# Patient Record
Sex: Male | Born: 1995 | Race: White | Hispanic: No | Marital: Single | Smoking: Current some day smoker
Health system: Southern US, Community
[De-identification: ages and names within clinical notes are randomized; demographics above are authoritative.]

## PROBLEM LIST (undated history)

## (undated) DIAGNOSIS — F1110 Opioid abuse, uncomplicated: Secondary | ICD-10-CM

## (undated) DIAGNOSIS — F329 Major depressive disorder, single episode, unspecified: Secondary | ICD-10-CM

## (undated) DIAGNOSIS — F909 Attention-deficit hyperactivity disorder, unspecified type: Secondary | ICD-10-CM

## (undated) DIAGNOSIS — F1310 Sedative, hypnotic or anxiolytic abuse, uncomplicated: Secondary | ICD-10-CM

## (undated) DIAGNOSIS — F419 Anxiety disorder, unspecified: Secondary | ICD-10-CM

## (undated) DIAGNOSIS — F32A Depression, unspecified: Secondary | ICD-10-CM

## (undated) HISTORY — DX: Anxiety disorder, unspecified: F41.9

## (undated) HISTORY — DX: Opioid abuse, uncomplicated: F11.10

## (undated) HISTORY — DX: Sedative, hypnotic or anxiolytic abuse, uncomplicated: F13.10

## (undated) HISTORY — DX: Attention-deficit hyperactivity disorder, unspecified type: F90.9

## (undated) HISTORY — PX: CYSTIC HYGROMA EXCISION: SHX450

## (undated) HISTORY — DX: Depression, unspecified: F32.A

## (undated) HISTORY — PX: KNEE ARTHROSCOPY WITH ANTERIOR CRUCIATE LIGAMENT (ACL) REPAIR: SHX5644

---
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
## 2017-01-06 ENCOUNTER — Ambulatory Visit (INDEPENDENT_AMBULATORY_CARE_PROVIDER_SITE_OTHER): Payer: BLUE CROSS/BLUE SHIELD | Admitting: Internal Medicine

## 2017-01-06 ENCOUNTER — Encounter: Payer: Self-pay | Admitting: Internal Medicine

## 2017-01-06 DIAGNOSIS — J40 Bronchitis, not specified as acute or chronic: Secondary | ICD-10-CM | POA: Diagnosis not present

## 2017-01-06 MED ORDER — DOXYCYCLINE HYCLATE 100 MG PO TABS: 100.0000 mg | ORAL_TABLET | Freq: Two times a day (BID) | ORAL | 0 refills | Status: DC

## 2017-01-06 NOTE — Patient Instructions (Signed)
Keep taking the zyrtec daily.   We have sent in doxycycline to take twice a day for 1 week to clear up the sinuses and lungs.

## 2017-01-07 DIAGNOSIS — J40 Bronchitis, not specified as acute or chronic: Secondary | ICD-10-CM | POA: Insufficient documentation

## 2017-01-07 NOTE — Assessment & Plan Note (Signed)
Rx for doxycycline BID for 1 week for symptoms. Note to return to work on Thursday given fever last night.

## 2017-01-07 NOTE — Progress Notes (Signed)
   Subjective:    Patient ID: Cameron Kent, male    DOB: [birth year], 21 y.o.   MRN: [id number]  HPI The patient is a 21 YO man coming in for cold symptoms for about 2-3 weeks. Started off more as allergies and in the last 1-2 weeks has gotten worse. Denies chills but some fevers in the last couple of days. He is coughing a lot and mostly clear to yellow. Denies ear pain or drainage. Some sinus pressure. Mild SOB with exertion and has been off work for several days with the fevers. He is having some nose drainage.He is taking zyrtec over the counter.   Review of Systems  Constitutional: Positive for activity change and fever. Negative for appetite change, chills, fatigue and unexpected weight change.  HENT: Positive for congestion, rhinorrhea and sinus pressure. Negative for ear discharge, ear pain, postnasal drip, sinus pain, sore throat, trouble swallowing and voice change.   Eyes: Negative.   Respiratory: Positive for cough and shortness of breath. Negative for chest tightness and wheezing.   Cardiovascular: Negative.   Musculoskeletal: Negative.   Neurological: Negative.       Objective:   Physical Exam  Constitutional: He is oriented to person, place, and time. He appears well-developed and well-nourished.  HENT:  Head: Normocephalic and atraumatic.  Oropharynx with redness and drainage, nose without crusting.   Eyes: EOM are normal.  Neck: Normal range of motion.  Cardiovascular: Normal rate and regular rhythm.   Pulmonary/Chest: Effort normal.  Scattered rhonchi in the bases which partially clear with coughing.   Abdominal: Soft.  Neurological: He is alert and oriented to person, place, and time.  Skin: Skin is warm and dry.   Vitals:   01/06/17 1316  BP: 112/60  Pulse: 71  Resp: 12  Temp: 98.2 F (36.8 C)  TempSrc: Oral  SpO2: 98%  Weight: 127 lb (57.6 kg)  Height: 5\' 11"  (1.803 m)      Assessment & Plan:

## 2018-03-12 ENCOUNTER — Ambulatory Visit: Payer: 59 | Admitting: Psychology

## 2018-03-12 DIAGNOSIS — F411 Generalized anxiety disorder: Secondary | ICD-10-CM

## 2018-03-26 ENCOUNTER — Ambulatory Visit: Payer: 59 | Admitting: Psychology

## 2018-04-09 ENCOUNTER — Ambulatory Visit: Payer: 59 | Admitting: Psychology

## 2018-04-09 DIAGNOSIS — F411 Generalized anxiety disorder: Secondary | ICD-10-CM | POA: Diagnosis not present

## 2018-05-05 ENCOUNTER — Ambulatory Visit: Payer: Self-pay | Admitting: Psychology

## 2018-05-19 ENCOUNTER — Ambulatory Visit: Payer: Self-pay | Admitting: Psychology

## 2018-10-18 ENCOUNTER — Encounter: Payer: Self-pay | Admitting: Internal Medicine

## 2018-10-18 ENCOUNTER — Ambulatory Visit: Payer: 59 | Admitting: Internal Medicine

## 2018-10-18 VITALS — BP 112/62 | HR 86 | Temp 99.5°F | Ht 71.0 in | Wt 145.2 lb

## 2018-10-18 DIAGNOSIS — J029 Acute pharyngitis, unspecified: Secondary | ICD-10-CM

## 2018-10-18 DIAGNOSIS — R6889 Other general symptoms and signs: Secondary | ICD-10-CM | POA: Diagnosis not present

## 2018-10-18 LAB — POCT INFLUENZA A/B
INFLUENZA A, POC: NEGATIVE
Influenza B, POC: NEGATIVE

## 2018-10-18 LAB — POCT RAPID STREP A (OFFICE): RAPID STREP A SCREEN: NEGATIVE

## 2018-10-18 NOTE — Progress Notes (Signed)
Chief Complaint  Patient presents with  . Sore Throat    pt states it hurts to swallow, has fever, chills and body ache    HPI: Cameron Kent 23 y.o. come in for SDA PCP APPT NA  onset x 2 days .  Of fever chills feeling sore throat and nasal congestion.   StatesFeels like flu   Had vaccine ,  Says feels like flu   "No cough yet" No strep exposure     Sore throat   Hurts  to swallow and nasal congestion  Ibuprofen .  Health treatment rest.  Able to take liquids but is difficult swallowing at times. [employer]  Missed yesterday.  Will need note for work  Temp  99.5    amox as baby  As a rash .  ROS: See pertinent positives and negatives per HPI. No cp sob  Vomiting diarrhea rash   No past medical history on file.  Family History  Problem Relation Age of Onset  . COPD Maternal Grandmother   . Asthma Maternal Grandmother   . Hearing loss Maternal Grandmother   . Heart disease Maternal Grandmother   . Hypertension Maternal Grandmother   . Hearing loss Paternal Grandfather     Social History   Socioeconomic History  . Marital status: Single    Spouse name: Not on file  . Number of children: Not on file  . Years of education: Not on file  . Highest education level: Not on file  Occupational History  . Not on file  Social Needs  . Financial resource strain: Not on file  . Food insecurity:    Worry: Not on file    Inability: Not on file  . Transportation needs:    Medical: Not on file    Non-medical: Not on file  Tobacco Use  . Smoking status: Current Some Day Smoker  . Smokeless tobacco: Never Used  Substance and Sexual Activity  . Alcohol use: Yes  . Drug use: Yes    Types: Marijuana  . Sexual activity: Never  Lifestyle  . Physical activity:    Days per week: Not on file    Minutes per session: Not on file  . Stress: Not on file  Relationships  . Social connections:    Talks on phone: Not on file    Gets together: Not on file    Attends religious  service: Not on file    Active member of club or organization: Not on file    Attends meetings of clubs or organizations: Not on file    Relationship status: Not on file  Other Topics Concern  . Not on file  Social History Narrative  . Not on file    Outpatient Medications Prior to Visit  Medication Sig Dispense Refill  . propranolol (INDERAL) 10 MG tablet TK 1 T PO BID PRN  FOR ANXIETY    . QUEtiapine (SEROQUEL) 100 MG tablet TK 1 T PO QHS    . sertraline (ZOLOFT) 100 MG tablet TK 2 TS PO QAM    . sertraline (ZOLOFT) 50 MG tablet     . ADDERALL XR 20 MG 24 hr capsule Take 20 mg by mouth daily.  0  . doxycycline (VIBRA-TABS) 100 MG tablet Take 1 tablet (100 mg total) by mouth 2 (two) times daily. 14 tablet 0  . doxycycline (VIBRAMYCIN) 100 MG capsule Take 1 capsule (100 mg total) by mouth daily. 90 capsule 1   No facility-administered medications  prior to visit.      EXAM:  BP 112/62 (BP Location: Right Arm, Patient Position: Sitting, Cuff Size: Normal)   Pulse 86   Temp 99.5 F (37.5 C) (Oral)   Ht 5\' 11"  (1.803 m)   Wt 145 lb 3.2 oz (65.9 kg)   BMI 20.25 kg/m   Body mass index is 20.25 kg/m.  GENERAL: vitals reviewed and listed above, alert, oriented, appears well hydrated  looks  Mildly ill non toxic   Uncomfortable  HEENT: atraumatic, conjunctiva  clear, no obvious abnormalities on inspection of external nose and ears tmx clear nose congested face nontender OP : tonsil 1+  Poss early exudate pin point  No edema    NECK: no obvious masses on inspection tender 1+ ac nodes shoddy pc  LUNGS: clear to auscultation bilaterally, no wheezes, rales or rhonchi, good air movement CV: HRRR, no clubbing cyanosis or  peripheral edema nl cap refill  Abdomen:  Sof,t normal bowel sounds without hepatosplenomegaly, no guarding rebound or masses no CVA tenderness  MS: moves all extremities without noticeable focal  Abnormality Skin: normal capillary refill ,turgor , color: No acute  rashes ,petechiae or bruising Neuro grossly non focal : pleasant and cooperative,   BP Readings from Last 3 Encounters:  10/18/18 112/62  01/06/17 112/60  12/10/15 (!) 90/48 (<1 %, Z <-2.33 /  3 %, Z = -1.91)*   *BP percentiles are based on the 2017 AAP Clinical Practice Guideline for boys  Rapid strep and flu screen are both negative.  ASSESSMENT AND PLAN:  Discussed the following assessment and plan:  Acute pharyngitis, unspecified etiology - Plan: POCT rapid strep A, Culture, Group A Strep  Flu-like symptoms - Plan: POC Influenza A/B Acute respiratory illness with throat anterior mild adenopathy upper respiratory congestion throat culture sent.  Option to take Tamiflu but is 48 hours into it and declines since no compelling reason. Supportive care note for work follow-up with alarm symptoms dehydration etc. Consider mono as a diagnosis if ongoing. -Patient advised to return or notify health care team  if  new concerns arise.  Patient Instructions  Checking for strep   Flu screen is  Can miss some cases of flu .   Gargles ibuprofen  rst fluids      Note for work  .  Fever  should be gone in another  2 days  If  persistent or progressive   Plan fu .  Cameron Kent. Azula Zappia M.D.

## 2018-10-18 NOTE — Patient Instructions (Addendum)
Checking for strep   Flu screen is  Can miss some cases of flu .   Gargles ibuprofen  rst fluids      Note for work  .  Fever  should be gone in another  2 days  If  persistent or progressive   Plan fu .

## 2018-10-20 ENCOUNTER — Telehealth: Payer: Self-pay | Admitting: Internal Medicine

## 2018-10-20 LAB — CULTURE, GROUP A STREP
MICRO NUMBER:: 233359
SPECIMEN QUALITY:: ADEQUATE

## 2018-10-20 NOTE — Telephone Encounter (Signed)
Reviewed lab result and physician's note with patient regarding -throat culture.

## 2018-12-08 ENCOUNTER — Encounter: Payer: Self-pay | Admitting: Internal Medicine

## 2018-12-08 ENCOUNTER — Ambulatory Visit (INDEPENDENT_AMBULATORY_CARE_PROVIDER_SITE_OTHER): Payer: 59 | Admitting: Internal Medicine

## 2018-12-08 DIAGNOSIS — F419 Anxiety disorder, unspecified: Secondary | ICD-10-CM | POA: Insufficient documentation

## 2018-12-08 DIAGNOSIS — F411 Generalized anxiety disorder: Secondary | ICD-10-CM

## 2018-12-08 DIAGNOSIS — F1111 Opioid abuse, in remission: Secondary | ICD-10-CM | POA: Diagnosis not present

## 2018-12-08 DIAGNOSIS — F1311 Sedative, hypnotic or anxiolytic abuse, in remission: Secondary | ICD-10-CM

## 2018-12-08 MED ORDER — BUSPIRONE HCL 10 MG PO TABS: 10.0000 mg | ORAL_TABLET | Freq: Two times a day (BID) | ORAL | 1 refills | Status: DC

## 2018-12-08 NOTE — Assessment & Plan Note (Signed)
Cameron Kent is complaining of severe anxiety.  He used to see Yvette Rack with [facility] psychiatry.  Apparently her services are not covered by his parents insurance any longer.  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  He was discharged on Seroquel to help him sleep, propranolol for panic attacks, and Zoloft 150 mg daily.  Zoloft seems to help a little.  He denies using drugs or alcohol.  He lives at home now.  He is not suicidal or homicidal. Continue with the Zoloft at 150 mg/day. Added BuSpar 10 mg twice a day Virtual your physical appointment in 10-14 days

## 2018-12-08 NOTE — Progress Notes (Signed)
Virtual Visit via Telephone Note  I connected with [name] on 12/08/18 at  3:00 PM EDT by telephone and verified that I am speaking with the correct person using two identifiers.   I discussed the limitations, risks, security and privacy concerns of performing an evaluation and management service by telephone and the availability of in person appointments. I also discussed with the patient that there may be a patient responsible charge related to this service. The patient expressed understanding and agreed to proceed.   History of Present Illness:   Cameron Kent is complaining of severe anxiety.  He used to see Cameron Kent with [facility] psychiatry.  Apparently her services are not covered by his parents insurance any longer.  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  He was discharged on Seroquel to help him sleep, propranolol for panic attacks, and Zoloft 150 mg daily.  Zoloft seems to help a little.  He denies using drugs or alcohol.  He lives at home now.  He is not suicidal or homicidal. Observations/Objective:  Montez Morita is in no acute distress.  He appears restless Assessment and Plan:  See plan Follow Up Instructions:    I discussed the assessment and treatment plan with the patient. The patient was provided an opportunity to ask questions and all were answered. The patient agreed with the plan and demonstrated an understanding of the instructions.   The patient was advised to call back or seek an in-person evaluation if the symptoms worsen or if the condition fails to improve as anticipated.  I provided 20 minutes of non-face-to-face time during this encounter.   Sonda Primes, MD

## 2019-11-14 ENCOUNTER — Ambulatory Visit: Payer: 59 | Admitting: Internal Medicine

## 2019-11-17 ENCOUNTER — Other Ambulatory Visit: Payer: Self-pay

## 2019-11-17 ENCOUNTER — Ambulatory Visit (INDEPENDENT_AMBULATORY_CARE_PROVIDER_SITE_OTHER): Payer: 59 | Admitting: Physician Assistant

## 2019-11-17 ENCOUNTER — Encounter: Payer: Self-pay | Admitting: Physician Assistant

## 2019-11-17 VITALS — BP 132/71 | HR 82 | Ht 72.0 in | Wt 138.0 lb

## 2019-11-17 DIAGNOSIS — G4700 Insomnia, unspecified: Secondary | ICD-10-CM

## 2019-11-17 DIAGNOSIS — F411 Generalized anxiety disorder: Secondary | ICD-10-CM

## 2019-11-17 DIAGNOSIS — F331 Major depressive disorder, recurrent, moderate: Secondary | ICD-10-CM

## 2019-11-17 MED ORDER — HYDROXYZINE HCL 10 MG PO TABS: 10.0000 mg | ORAL_TABLET | Freq: Three times a day (TID) | ORAL | 1 refills | Status: DC | PRN

## 2019-11-17 MED ORDER — FLUVOXAMINE MALEATE 50 MG PO TABS: 50.0000 mg | ORAL_TABLET | Freq: Every day | ORAL | 1 refills | Status: DC

## 2019-11-17 NOTE — Progress Notes (Signed)
[facility] MD/PA/NP Initial Note  11/17/2019 5:43 PM Cameron Kent  MRN:  [id number]  Chief Complaint:  Chief Complaint    Anxiety      HPI: He has had anxiety all of his life.  Looking back, he can see even in kindergarten he had separation anxiety.  Up until the fourth grade, he did not want to go to school and be separated from his parents.  He would become severely homesick if he ever went anywhere to spend the night.  As soon as it got dark, he got really anxious and wanted to go home.  In the past few years, he has sought help.  He saw Yvette Rack, NP at another practice a few years ago and was put on Zoloft.  That helped for a while but kind of made him numb.  He had a friend pass away and it did not really even bother him.  So he did not like that medication for that reason.  He is also had trouble sleeping for quite some time and he was put on Seroquel for that.  He only takes it as needed and it is helpful.  He does not need it every night.  He had Ativan for the anxiety but states it was not that effective.  For the past few months, on top of the anxiety he has been experiencing some sadness.  He does not enjoy things as much as he did.  He is able to go to work.  Energy and motivation are okay.  But he has had fleeting suicidal ideations.  "I do not have a plan.  I just sometimes wonder what it would be like if I were not here."  States that is new for him and he realizes that he needed to get back in and get help.  He has never had times of increased energy with decreased need for sleep except at times of anxiety he will not be able to sleep well.  No impulsivity or risky behaviors.  No increased spending or grandiosity, no irritability, no hallucinations.  The anxiety is a daily occurrence.  He worries about things a lot, even things out of his control.  He does have some OCD tendencies and likes things in a certain way, will check door locks sometimes but these things do not  cut into his day, make him late for work or that kind of thing.  He will get panic attacks several days a week.  That is associated with palpitations, he gets clammy, sometimes short of breath talk when he is that anxious.  It usually last anywhere from 20 to 30 minutes most of the time.  But it feels like a lot longer than that.  Visit Diagnosis:    ICD-10-CM   1. Generalized anxiety disorder  F41.1   2. Major depressive disorder, recurrent episode, moderate (HCC)  F33.1   3. Insomnia, unspecified type  G47.00     Past Psychiatric History:  Has a history of cutting but not in a while.  The last time was in 2019.  He has never been hospitalized for any psychiatric reasons.  He has never had a suicide attempt.  Past medications for mental health diagnoses include: Zoloft helped initially but caused emotional flattening,  Prozac no relief, Seroquel for sleep which helped, Trazodone worked for [employer] but then quit.  Ativan wasn't always effective.   Past Medical History:  Past Medical History:  Diagnosis Date  . ADHD (attention deficit hyperactivity  disorder)    took Adderall as a kid  . Anxiety   . Depression     Past Surgical History:  Procedure Laterality Date  . CYSTIC HYGROMA EXCISION    . KNEE ARTHROSCOPY WITH ANTERIOR CRUCIATE LIGAMENT (ACL) REPAIR Right   . LAPAROSCOPIC APPENDECTOMY  12/27/2011   Procedure: APPENDECTOMY LAPAROSCOPIC;  Surgeon: Judie Petit. Leonia Corona, MD;  Location: [facility] OR;  Service: Pediatrics;  Laterality: N/A;    Family Psychiatric History:   Family History:  Family History  Problem Relation Age of Onset  . COPD Maternal Grandmother   . Asthma Maternal Grandmother   . Hearing loss Maternal Grandmother   . Heart disease Maternal Grandmother   . Hypertension Maternal Grandmother   . Hearing loss Paternal Grandfather   . Bone cancer Paternal Grandfather   . Anxiety disorder Mother   . Hypertension Father   . Healthy Sister   . Dementia Maternal Grandfather    . Healthy Paternal Grandmother   . Bipolar disorder Cousin     Social History:  Social History   Socioeconomic History  . Marital status: Single    Spouse name: Not on file  . Number of children: 0  . Years of education: Not on file  . Highest education level: Some college, no degree  Occupational History  . Occupation: Adriana Simas     Comment: [employer]  Tobacco Use  . Smoking status: Current Some Day Smoker    Types: E-cigarettes  . Smokeless tobacco: Never Used  Substance and Sexual Activity  . Alcohol use: Yes    Alcohol/week: 0.0 - 1.0 standard drinks  . Drug use: Yes    Types: Marijuana    Comment: qd  . Sexual activity: Never  Other Topics Concern  . Not on file  Social History Narrative   Single, never married.  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   Lives alone, he moved out of parent's home at 55, with a roommate.  No roommate now.   Went to [facility] for a little while, didn't know what he wanted to do so dropped out.  Hopes to go back sometime.   Home-life was good at home. Mom is retired from Control and instrumentation engineer, and Dad sells package labels.    Sister is 42 yo.   No religious beliefs.    Legal- caught with marijuana. He had to do 10 hours of community service.   Caffeine-1 coke per day occas.   Social Determinants of Health   Financial Resource Strain:   . Difficulty of Paying Living Expenses:   Food Insecurity:   . Worried About Programme researcher, broadcasting/film/video in the Last Year:   . Barista in the Last Year:   Transportation Needs:   . Freight forwarder (Medical):   Marland Kitchen Lack of Transportation (Non-Medical):   Physical Activity:   . Days of Exercise per Week:   . Minutes of Exercise per Session:   Stress:   . Feeling of Stress :   Social Connections:   . Frequency of Communication with Friends and Family:   . Frequency of Social Gatherings with Friends and Family:   . Attends Religious Services:   . Active Member of Clubs or  Organizations:   . Attends Banker Meetings:   Marland Kitchen Marital Status:     Allergies:  Allergies  Allergen Reactions  . Amoxicillin Rash    Metabolic Disorder Labs: No results found for: HGBA1C, MPG No results  found for: PROLACTIN No results found for: CHOL, TRIG, HDL, CHOLHDL, VLDL, LDLCALC Lab Results  Component Value Date   TSH 0.61 12/10/2015    Therapeutic Level Labs: No results found for: LITHIUM No results found for: VALPROATE No components found for:  CBMZ  Current Medications: Current Outpatient Medications  Medication Sig Dispense Refill  . QUEtiapine (SEROQUEL) 100 MG tablet Take 100 mg by mouth at bedtime as needed.     . fluvoxaMINE (LUVOX) 50 MG tablet Take 1 tablet (50 mg total) by mouth at bedtime. 30 tablet 1  . hydrOXYzine (ATARAX/VISTARIL) 10 MG tablet Take 1-3 tablets (10-30 mg total) by mouth 3 (three) times daily as needed. 60 tablet 1   No current facility-administered medications for this visit.    Medication Side Effects: none  Orders placed this visit:  No orders of the defined types were placed in this encounter.   Psychiatric Specialty Exam:  Review of Systems  Constitutional: Negative.   HENT: Negative.   Eyes: Negative.   Respiratory: Negative.   Cardiovascular: Negative.   Gastrointestinal: Negative.   Endocrine: Negative.   Genitourinary: Negative.   Musculoskeletal: Negative.   Skin: Negative.   Allergic/Immunologic: Negative.   Neurological: Negative.   Hematological: Negative.   Psychiatric/Behavioral: Positive for sleep disturbance and suicidal ideas. Negative for agitation, behavioral problems, confusion, decreased concentration, dysphoric mood, hallucinations and self-injury. The patient is nervous/anxious. The patient is not hyperactive.        Passive SI    Blood pressure 132/71, pulse 82, height 6' (1.829 m), weight 138 lb (62.6 kg).Body mass index is 18.72 kg/m.  General Appearance: Casual, Neat and Well  Groomed  Eye Contact:  Good  Speech:  Clear and Coherent and Normal Rate  Volume:  Normal  Mood:  Anxious  Affect:  Anxious  Thought Process:  Goal Directed and Descriptions of Associations: Intact  Orientation:  Full (Time, Place, and Person)  Thought Content: Logical   Suicidal Thoughts:  No  Homicidal Thoughts:  No  Memory:  WNL  Judgement:  Good  Insight:  Good  Psychomotor Activity:  Normal  Concentration:  Concentration: Good and Attention Span: Good  Recall:  Good  Fund of Knowledge: Good  Language: Good  Assets:  Desire for Improvement  ADL's:  Intact  Cognition: WNL  Prognosis:  Good   Screenings:  GAD-7     Office Visit from 11/17/2019 in [facility]  Total GAD-7 Score  17    PHQ2-9     Office Visit from 11/17/2019 in [facility]  PHQ-2 Total Score  2  PHQ-9 Total Score  8      Receiving Psychotherapy: No   Treatment Plan/Recommendations:  PDMP was reviewed. I spent 1 hour with him. We discussed the diagnoses of anxiety, depression, insomnia and how they can all be intertwined.  Zoloft is a great drug for anxiety and depression so we could either restart that or try different 1.  He would like to try something else since he felt numb on that 1.  I suggest we try 1 more SSRI before going to a different class.  I recommend Luvox because that will help with sleep, anxiety, depression, and any OCD tendencies.  Hopefully we can take care of all those problems with one drug.  We discussed benefits, risks, side effects and he accepts. For the panic attacks, I recommended hydroxyzine rather than trying a different benzodiazepine.  We discussed the addictive potential of benzos and we need  to limit that if at all possible.  He understands and accepts.  I recommend hydroxyzine.  Sedation precautions were given.  Other side effects such as dry mouth, constipation can occur.  He understands. Start Luvox 50 mg, 1 p.o. nightly. Start hydroxyzine  10 mg, 1-3 p.o. 3 times daily as needed. Continue Seroquel 100 mg, 1 p.o. nightly as needed.  He may not need this with taking the Luvox however. I recommend that he get back in counseling.  He will think about it. Return in 4 to 6 weeks.   Melony Overly, PA-C

## 2019-12-29 ENCOUNTER — Encounter: Payer: Self-pay | Admitting: Physician Assistant

## 2019-12-29 ENCOUNTER — Ambulatory Visit (INDEPENDENT_AMBULATORY_CARE_PROVIDER_SITE_OTHER): Payer: 59 | Admitting: Physician Assistant

## 2019-12-29 ENCOUNTER — Other Ambulatory Visit: Payer: Self-pay

## 2019-12-29 DIAGNOSIS — G4700 Insomnia, unspecified: Secondary | ICD-10-CM | POA: Diagnosis not present

## 2019-12-29 DIAGNOSIS — F331 Major depressive disorder, recurrent, moderate: Secondary | ICD-10-CM

## 2019-12-29 DIAGNOSIS — F411 Generalized anxiety disorder: Secondary | ICD-10-CM

## 2019-12-29 MED ORDER — QUETIAPINE FUMARATE 100 MG PO TABS: 50.0000 mg | ORAL_TABLET | Freq: Every evening | ORAL | 1 refills | Status: DC | PRN

## 2019-12-29 MED ORDER — HYDROXYZINE HCL 25 MG PO TABS: 25.0000 mg | ORAL_TABLET | Freq: Three times a day (TID) | ORAL | 1 refills | Status: DC | PRN

## 2019-12-29 MED ORDER — FLUVOXAMINE MALEATE 100 MG PO TABS: 100.0000 mg | ORAL_TABLET | Freq: Every day | ORAL | 1 refills | Status: DC

## 2019-12-29 NOTE — Progress Notes (Signed)
[facility] Med Check  Patient ID: Cameron Kent,  MRN: [id number]  PCP: Tresa Garter, MD  Date of Evaluation: 12/29/2019 Time spent:20 minutes  Chief Complaint:  Chief Complaint    Anxiety; Depression; Insomnia      HISTORY/CURRENT STATUS: HPI For routine med check.  Started Luvox and Hydroxyzine about a month ago. Thinks he might be a little better as far 'taking the edge off' with the Luvox.  States he has had 2 severe panic attacks since the last visit.  He took 20 mg of hydroxyzine which was not beneficial.  One of the panic attacks was so severe he walked off the job.  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  States he is not having any side effects from the medications.  But he needs something to help him that works quickly.  The panic attack will last approximately 30 minutes.  He has shortness of breath, palpitations and feels sweaty when they happen.  States he has old propranolol he thinks is 10 mg, at home and asked if he could use that.  Patient denies loss of interest in usual activities and is able to enjoy things.  Denies decreased energy or motivation.  Appetite has not changed. Denies any changes in concentration, making decisions or remembering things.  Denies suicidal or homicidal thoughts.  Patient denies increased energy with decreased need for sleep, no increased talkativeness, no racing thoughts, no impulsivity or risky behaviors, no increased spending, no increased libido, no grandiosity, no increased irritability or anger, and no hallucinations.  Denies dizziness, syncope, seizures, numbness, tingling, tremor, tics, unsteady gait, slurred speech, confusion. Denies muscle or joint pain, stiffness, or dystonia.  Individual Medical History/ Review of Systems: Changes? :No    Past medications for mental health diagnoses include: Zoloft helped initially but caused emotional flattening,  Prozac no relief, Seroquel for sleep which helped,  Trazodone worked for [employer] but then quit.  Ativan wasn't always effective, Propranolol for anxiety. Burprenorphine/Naloxone  Allergies: Amoxicillin  Current Medications:  Current Outpatient Medications:  .  propranolol (INDERAL) 10 MG tablet, Take 10 mg by mouth 4 (four) times daily as needed., Disp: , Rfl:  .  QUEtiapine (SEROQUEL) 100 MG tablet, Take 0.5 tablets (50 mg total) by mouth at bedtime as needed., Disp: 15 tablet, Rfl: 1 .  Buprenorphine HCl-Naloxone HCl 8-2 MG FILM, Place under the tongue 2 (two) times daily., Disp: , Rfl:  .  fluvoxaMINE (LUVOX) 100 MG tablet, Take 1 tablet (100 mg total) by mouth at bedtime., Disp: 30 tablet, Rfl: 1 .  hydrOXYzine (ATARAX/VISTARIL) 25 MG tablet, Take 1-2 tablets (25-50 mg total) by mouth every 8 (eight) hours as needed., Disp: 60 tablet, Rfl: 1 Medication Side Effects: none  Family Medical/ Social History: Changes? Yes new job at [employer]  MENTAL HEALTH EXAM:  There were no vitals taken for this visit.There is no height or weight on file to calculate BMI.  General Appearance: Casual, Neat and Well Groomed  Eye Contact:  Good  Speech:  Clear and Coherent and Normal Rate  Volume:  Normal  Mood:  Euthymic  Affect:  Appropriate  Thought Process:  Goal Directed and Descriptions of Associations: Intact  Orientation:  Full (Time, Place, and Person)  Thought Content: Logical   Suicidal Thoughts:  No  Homicidal Thoughts:  No  Memory:  WNL  Judgement:  Good  Insight:  Good  Psychomotor Activity:  Restlessness and fidgety  Concentration:  Concentration: Good  Recall:  Good  Fund of Knowledge: Good  Language: Good  Assets:  Desire for Improvement  ADL's:  Intact  Cognition: WNL  Prognosis:  Good    DIAGNOSES:    ICD-10-CM   1. Major depressive disorder, recurrent episode, moderate (HCC)  F33.1   2. Generalized anxiety disorder  F41.1   3. Insomnia, unspecified type  G47.00     Receiving Psychotherapy: No    RECOMMENDATIONS:   PDMP reviewed.  Patient was confronted that he did not tell me about the buprenorphine/naloxone.  He states he stopped taking it a few weeks ago.  "I do not want anything in my body that is not helping."  I spent 20 minutes with him. Increase Luvox to 100 mg, 1 p.o. nightly. Increase hydroxyzine 25 mg, 1-2 3 times daily as needed. Continue Seroquel 100 mg, 1/2 pill nightly as needed sleep. Continue propranolol 10 mg, 1 4 times daily as needed.  Do not take if dizzy.  (He was prescribed this at some point in the past and states it helped with physical symptoms of anxiety.) Return in 6 weeks.  Donnal Moat, PA-C

## 2020-01-19 ENCOUNTER — Ambulatory Visit: Payer: 59 | Attending: Internal Medicine

## 2020-01-19 DIAGNOSIS — Z23 Encounter for immunization: Secondary | ICD-10-CM

## 2020-01-19 NOTE — Progress Notes (Signed)
   Covid-19 Vaccination Clinic  Name:  SARGENT MANKEY    MRN: [id number] DOB: 06-Nov-1995  01/19/2020  Mr. Denn was observed post Covid-19 immunization for 15 minutes without incident. He was provided with Vaccine Information Sheet and instruction to access the V-Safe system.   Mr. Selvidge was instructed to call 911 with any severe reactions post vaccine: Marland Kitchen Difficulty breathing  . Swelling of face and throat  . A fast heartbeat  . A bad rash all over body  . Dizziness and weakness   Immunizations Administered    Name Date Dose VIS Date Route   Moderna COVID-19 Vaccine 01/19/2020  3:20 PM 0.5 mL 07/2019 Intramuscular   Manufacturer: Moderna   Lot: [id number]   NDC: 45364-680-32

## 2020-02-07 ENCOUNTER — Ambulatory Visit: Payer: 59 | Admitting: Physician Assistant

## 2020-02-07 ENCOUNTER — Ambulatory Visit (INDEPENDENT_AMBULATORY_CARE_PROVIDER_SITE_OTHER): Payer: 59 | Admitting: Internal Medicine

## 2020-02-07 ENCOUNTER — Other Ambulatory Visit: Payer: Self-pay

## 2020-02-07 ENCOUNTER — Encounter: Payer: Self-pay | Admitting: Internal Medicine

## 2020-02-07 VITALS — BP 122/78 | HR 63 | Temp 97.9°F | Ht 72.0 in | Wt 127.0 lb

## 2020-02-07 DIAGNOSIS — F411 Generalized anxiety disorder: Secondary | ICD-10-CM

## 2020-02-07 DIAGNOSIS — F1111 Opioid abuse, in remission: Secondary | ICD-10-CM | POA: Diagnosis not present

## 2020-02-07 DIAGNOSIS — F1311 Sedative, hypnotic or anxiolytic abuse, in remission: Secondary | ICD-10-CM | POA: Diagnosis not present

## 2020-02-07 DIAGNOSIS — Z0000 Encounter for general adult medical examination without abnormal findings: Secondary | ICD-10-CM

## 2020-02-07 DIAGNOSIS — Z0001 Encounter for general adult medical examination with abnormal findings: Secondary | ICD-10-CM | POA: Diagnosis not present

## 2020-02-07 MED ORDER — CLONIDINE HCL 0.1 MG PO TABS: 0.1000 mg | ORAL_TABLET | Freq: Two times a day (BID) | ORAL | 0 refills | Status: DC

## 2020-02-07 MED ORDER — VITAMIN D3 50 MCG (2000 UT) PO CAPS: 2000.0000 [IU] | ORAL_CAPSULE | Freq: Every day | ORAL | 3 refills | Status: DC

## 2020-02-07 NOTE — Assessment & Plan Note (Addendum)
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  He was discharged on Seroquel to help him sleep, propranolol for panic attacks, and Zoloft 150 mg daily.  Zoloft seems to help a little.  He denies using drugs or alcohol.  He lives at home now.  He is not suicidal or homicidal. 6/21[name] has had a relapse of his opioid addiction in the winter and spring 2021.  He was snorting heroin and was admitted to inpatient rehab 3 months ago.  He was also using Xanax.  He has been treated at [facility] psychiatry with Suboxone.  He sees them twice a month.  He is very worried about the potential withdrawal symptoms if he stops Suboxone.  He knows not to stop it unless he is instructed by [employer].  He knows that he can use Suboxone long-term.  He has been smoking 1 g of pot daily.  He tells me that at [facility] they run his drug screen test and that they are aware of his marijuana use.  He has been working at [employer] 3 hours a week.  He lives in the house.  His family is very supportive.  He is not very physically active.  He is not suicidal or depressed. On Fluvoxamine, Inderal,

## 2020-02-07 NOTE — Assessment & Plan Note (Addendum)
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  He was discharged on Seroquel to help him sleep, propranolol for panic attacks, and Zoloft 150 mg daily.  Zoloft seems to help a little.  He denies using drugs or alcohol.  He lives at home now.  He is not suicidal or homicidal. 6/21[name] has had a relapse of his opioid addiction in the winter and spring 2021.  He was snorting heroin and was admitted to inpatient rehab 3 months ago.  He was also using Xanax.  He has been treated at [facility] psychiatry with Suboxone.  He sees them twice a month.  He is very worried about the potential withdrawal symptoms if he stops Suboxone.  He knows not to stop it unless he is instructed by [employer].  He knows that he can use Suboxone long-term.  He has been smoking 1 g of pot daily.  He tells me that at [facility] they run his drug screen test and that they are aware of his marijuana use.  He has been working at [employer] 3 hours a week.  He lives in the house.  His family is very supportive.  He is not very physically active.  He is not suicidal or depressed. On Fluvoxamine, Inderal,  I think it would be reasonable to have Carmer have a small prescription for clonidine.

## 2020-02-07 NOTE — Assessment & Plan Note (Signed)
No recent use

## 2020-02-07 NOTE — Progress Notes (Signed)
Subjective:  Patient ID: Cameron Kent, male    DOB: June 20, 1996  Age: 24 y.o. MRN: [id number]  CC: No chief complaint on file.   HPI Cameron Kent presents for a well exam. Cameron Kent has had a relapse of his opioid addiction in the winter and spring 2021.  He was snorting heroin and was admitted to inpatient rehab 3 months ago.  He was also using Xanax.  He has been treated at [facility] psychiatry with Suboxone.  He sees them twice a month.  He is very worried about the potential withdrawal symptoms if he stops Suboxone.  He knows not to stop it unless he is instructed by Cameron Kent.  He knows that he can use Suboxone long-term.  He has been smoking 1 g of pot daily.  He tells me that at [facility] they run his drug screen test and that they are aware of his marijuana use.  He has been working at [employer] 3 hours a week.  He lives in the house.  His family is very supportive.  He is not very physically active.  He is not suicidal or depressed.  Outpatient Medications Prior to Visit  Medication Sig Dispense Refill  . Buprenorphine HCl-Naloxone HCl 8-2 MG FILM Place under the tongue 2 (two) times daily.    . fluvoxaMINE (LUVOX) 100 MG tablet Take 1 tablet (100 mg total) by mouth at bedtime. 30 tablet 1  . hydrOXYzine (ATARAX/VISTARIL) 25 MG tablet Take 1-2 tablets (25-50 mg total) by mouth every 8 (eight) hours as needed. 60 tablet 1  . propranolol (INDERAL) 10 MG tablet Take 10 mg by mouth 4 (four) times daily as needed.    Marland Kitchen QUEtiapine (SEROQUEL) 100 MG tablet Take 0.5 tablets (50 mg total) by mouth at bedtime as needed. 15 tablet 1   No facility-administered medications prior to visit.    ROS: Review of Systems  Constitutional: Negative for appetite change, fatigue and unexpected weight change.  HENT: Negative for congestion, nosebleeds, sneezing, sore throat and trouble swallowing.   Eyes: Negative for itching and visual disturbance.  Respiratory: Negative for cough.    Cardiovascular: Negative for chest pain, palpitations and leg swelling.  Gastrointestinal: Negative for abdominal distention, blood in stool, diarrhea and nausea.  Genitourinary: Negative for frequency and hematuria.  Musculoskeletal: Negative for back pain, gait problem, joint swelling and neck pain.  Skin: Negative for rash.  Neurological: Negative for dizziness, tremors, speech difficulty and weakness.  Psychiatric/Behavioral: Positive for sleep disturbance. Negative for agitation, dysphoric mood and suicidal ideas. The patient is nervous/anxious.     Objective:  BP 122/78 (BP Location: Left Arm, Patient Position: Sitting, Cuff Size: Normal)   Pulse 63   Temp 97.9 F (36.6 C) (Oral)   Ht 6' (1.829 m)   Wt 127 lb (57.6 kg)   SpO2 96%   BMI 17.22 kg/m   BP Readings from Last 3 Encounters:  02/07/20 122/78  10/18/18 112/62  01/06/17 112/60    Wt Readings from Last 3 Encounters:  02/07/20 127 lb (57.6 kg)  10/18/18 145 lb 3.2 oz (65.9 kg)  01/06/17 127 lb (57.6 kg) (11 %, Z= -1.25)*   * Growth percentiles are based on CDC (Boys, 2-20 Years) data.    Physical Exam Constitutional:      General: He is not in acute distress.    Appearance: He is well-developed.     Comments: NAD  Eyes:     Conjunctiva/sclera: Conjunctivae normal.  Pupils: Pupils are equal, round, and reactive to light.  Neck:     Thyroid: No thyromegaly.     Vascular: No JVD.  Cardiovascular:     Rate and Rhythm: Normal rate and regular rhythm.     Heart sounds: Normal heart sounds. No murmur heard.  No friction rub. No gallop.   Pulmonary:     Effort: Pulmonary effort is normal. No respiratory distress.     Breath sounds: Normal breath sounds. No wheezing or rales.  Chest:     Chest wall: No tenderness.  Abdominal:     General: Bowel sounds are normal. There is no distension.     Palpations: Abdomen is soft. There is no mass.     Tenderness: There is no abdominal tenderness. There is no  guarding or rebound.  Musculoskeletal:        General: No tenderness. Normal range of motion.     Cervical back: Normal range of motion.  Lymphadenopathy:     Cervical: No cervical adenopathy.  Skin:    General: Skin is warm and dry.     Findings: No rash.  Neurological:     Mental Status: He is alert and oriented to person, place, and time.     Cranial Nerves: No cranial nerve deficit.     Motor: No abnormal muscle tone.     Coordination: Coordination normal.     Gait: Gait normal.     Deep Tendon Reflexes: Reflexes are normal and symmetric.  Psychiatric:        Behavior: Behavior normal.        Thought Content: Thought content normal.        Judgment: Judgment normal.   Thin.  No acute distress.  No tremor.  Lab Results  Component Value Date   WBC 7.3 12/10/2015   HGB 14.7 12/10/2015   HCT 42.3 12/10/2015   PLT 232.0 12/10/2015   GLUCOSE 90 12/10/2015   ALT 16 12/10/2015   AST 24 12/10/2015   NA 135 12/10/2015   K 4.0 12/10/2015   CL 99 12/10/2015   CREATININE 0.91 12/10/2015   BUN 12 12/10/2015   CO2 25 12/10/2015   TSH 0.61 12/10/2015    No results found.  Assessment & Plan:    Walker Kehr, MD

## 2020-02-07 NOTE — Assessment & Plan Note (Addendum)
  We discussed age appropriate health related issues, including available/recomended screening tests and vaccinations. Labs were ordered to be later reviewed . All questions were answered. We discussed one or more of the following - seat belt use, use of sunscreen/sun exposure exercise, safe sex,  firearm use and storage, seat belt use, a need for adhering to healthy diet and exercise. Labs were ordered . All questions were answered. Cameron Kent is planning to attend [employer] in the fall.  He will start exercising. [facility] - seeing a psychiatrist. On Suboxone. On Fluvoxamine, Inderal,

## 2020-02-16 ENCOUNTER — Ambulatory Visit: Payer: 59 | Attending: Internal Medicine

## 2020-02-16 DIAGNOSIS — Z23 Encounter for immunization: Secondary | ICD-10-CM

## 2020-02-16 NOTE — Progress Notes (Signed)
° °  Covid-19 Vaccination Clinic  Name:  EDRIC FETTERMAN    MRN: [id number] DOB: Nov 20, 1995  02/16/2020  Mr. Pendergraft was observed post Covid-19 immunization for 15 minutes without incident. He was provided with Vaccine Information Sheet and instruction to access the V-Safe system.   Mr. Derrig was instructed to call 911 with any severe reactions post vaccine:  Difficulty breathing   Swelling of face and throat   A fast heartbeat   A bad rash all over body   Dizziness and weakness   Immunizations Administered    Name Date Dose VIS Date Route   Moderna COVID-19 Vaccine 02/16/2020  3:19 PM 0.5 mL 07/2019 Intramuscular   Manufacturer: Moderna   Lot: [id number]   NDC: 46190-122-24

## 2020-03-18 ENCOUNTER — Other Ambulatory Visit: Payer: Self-pay | Admitting: Internal Medicine

## 2020-03-26 ENCOUNTER — Other Ambulatory Visit: Payer: Self-pay | Admitting: Physician Assistant

## 2020-03-28 NOTE — Telephone Encounter (Signed)
    Patient's mother Santina Evans calling to request refill on fluvoxaMINE (LUVOX) 100 MG tablet.  Advised her to reach to [facility] for refill. She states they are not happy with the care patient received there.She is concerned about the patient being off medication for extended time Appointment scheduled for 8/10  Please advise

## 2020-03-28 NOTE — Telephone Encounter (Signed)
Mom called to check on status of refill of [name]'s flouxamine.  He has been out 2 days and she said he is a mess and needs his medication.  She did not schedule and appt because they are transferring his care to [facility] so he can have counseling and Med. Mgmt.  We couldn't provide the counseling here.  They have an appt in about 2 weeks.  She is asking for 1 more 30 day prescription to get him through until he gets re-established with the new dr.

## 2020-03-28 NOTE — Telephone Encounter (Signed)
Read patient message from his other provider before sending refill

## 2020-03-29 NOTE — Telephone Encounter (Signed)
Reviewed

## 2020-03-30 NOTE — Telephone Encounter (Signed)
Medication has already been sent in for pt 03/28/20 from a different provider

## 2020-04-03 ENCOUNTER — Other Ambulatory Visit: Payer: Self-pay

## 2020-04-03 ENCOUNTER — Encounter: Payer: Self-pay | Admitting: Internal Medicine

## 2020-04-03 ENCOUNTER — Ambulatory Visit: Payer: 59 | Admitting: Internal Medicine

## 2020-04-03 DIAGNOSIS — F1220 Cannabis dependence, uncomplicated: Secondary | ICD-10-CM | POA: Diagnosis not present

## 2020-04-03 DIAGNOSIS — F1111 Opioid abuse, in remission: Secondary | ICD-10-CM | POA: Diagnosis not present

## 2020-04-03 DIAGNOSIS — F1311 Sedative, hypnotic or anxiolytic abuse, in remission: Secondary | ICD-10-CM | POA: Diagnosis not present

## 2020-04-03 MED ORDER — CLONIDINE HCL 0.1 MG PO TABS: 0.1000 mg | ORAL_TABLET | Freq: Two times a day (BID) | ORAL | 1 refills | Status: AC | PRN

## 2020-04-03 MED ORDER — QUETIAPINE FUMARATE 100 MG PO TABS: 50.0000 mg | ORAL_TABLET | Freq: Every evening | ORAL | 1 refills | Status: AC | PRN

## 2020-04-03 MED ORDER — PROPRANOLOL HCL 10 MG PO TABS: 10.0000 mg | ORAL_TABLET | Freq: Four times a day (QID) | ORAL | 3 refills | Status: AC | PRN

## 2020-04-03 NOTE — Progress Notes (Signed)
Subjective:  Patient ID: Cameron Kent, male    DOB: [birth year]  Age: 24 y.o. 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 He is asking me to prescribe his meds. He did not "click" w/[facility]   Outpatient Medications Prior to Visit  Medication Sig Dispense Refill  . cloNIDine (CATAPRES) 0.1 MG tablet TAKE 1 TABLET(0.1 MG) BY MOUTH TWICE DAILY AS DIRECTED 30 tablet 0  . fluvoxaMINE (LUVOX) 100 MG tablet TAKE 1 TABLET(100 MG) BY MOUTH AT BEDTIME 30 tablet 1  . propranolol (INDERAL) 10 MG tablet Take 10 mg by mouth 4 (four) times daily as needed.    Marland Kitchen QUEtiapine (SEROQUEL) 100 MG tablet Take 0.5 tablets (50 mg total) by mouth at bedtime as needed. 15 tablet 1  . Buprenorphine HCl-Naloxone HCl 8-2 MG FILM Place under the tongue 2 (two) times daily. (Patient not taking: Reported on 04/03/2020)    . Cholecalciferol (VITAMIN D3) 50 MCG (2000 UT) capsule Take 1 capsule (2,000 Units total) by mouth daily. (Patient not taking: Reported on 04/03/2020) 100 capsule 3   No facility-administered medications prior to visit.    ROS: Review of Systems  Constitutional: Positive for unexpected weight change. Negative for appetite change and fatigue.  HENT: Negative for congestion, nosebleeds, sneezing, sore throat and trouble swallowing.   Eyes: Negative for itching and visual disturbance.  Respiratory: Negative for cough.   Cardiovascular: Negative for chest pain, palpitations and leg swelling.  Gastrointestinal: Negative for abdominal distention, blood in stool, diarrhea and nausea.  Genitourinary: Negative for frequency and hematuria.  Musculoskeletal: Negative for back pain, gait problem, joint swelling and neck pain.  Skin: Negative for rash.  Neurological: Negative for dizziness, tremors,  speech difficulty and weakness.  Psychiatric/Behavioral: Positive for dysphoric mood. Negative for agitation, behavioral problems, confusion, decreased concentration, hallucinations, self-injury, sleep disturbance and suicidal ideas. The patient is nervous/anxious. The patient is not hyperactive.     Objective:  BP 110/62 (BP Location: Left Arm)   Pulse (!) 56   Temp 98.1 F (36.7 C) (Oral)   Wt 122 lb 6.4 oz (55.5 kg)   SpO2 96%   BMI 16.60 kg/m   BP Readings from Last 3 Encounters:  04/03/20 110/62  02/07/20 122/78  10/18/18 112/62    Wt Readings from Last 3 Encounters:  04/03/20 122 lb 6.4 oz (55.5 kg)  02/07/20 127 lb (57.6 kg)  10/18/18 145 lb 3.2 oz (65.9 kg)    Physical Exam Constitutional:      General: He is not in acute distress.    Appearance: He is well-developed.     Comments: NAD  Eyes:     Conjunctiva/sclera: Conjunctivae normal.     Pupils: Pupils are equal, round, and reactive to light.  Neck:     Thyroid: No thyromegaly.     Vascular: No JVD.  Cardiovascular:     Rate and Rhythm: Normal rate and regular rhythm.     Heart sounds: Normal heart sounds. No murmur heard.  No friction rub. No gallop.   Pulmonary:     Effort: Pulmonary effort is normal. No respiratory distress.     Breath sounds: Normal breath sounds. No wheezing or rales.  Chest:     Chest wall: No tenderness.  Abdominal:     General: Bowel sounds are  normal. There is no distension.     Palpations: Abdomen is soft. There is no mass.     Tenderness: There is no abdominal tenderness. There is no guarding or rebound.  Musculoskeletal:        General: No tenderness. Normal range of motion.     Cervical back: Normal range of motion.  Lymphadenopathy:     Cervical: No cervical adenopathy.  Skin:    General: Skin is warm and dry.     Findings: No rash.  Neurological:     Mental Status: He is alert and oriented to person, place, and time.     Cranial Nerves: No cranial nerve deficit.      Motor: No abnormal muscle tone.     Coordination: Coordination normal.     Gait: Gait normal.     Deep Tendon Reflexes: Reflexes are normal and symmetric.  Psychiatric:        Behavior: Behavior normal.        Thought Content: Thought content normal.        Judgment: Judgment normal.   thin  Lab Results  Component Value Date   WBC 7.3 12/10/2015   HGB 14.7 12/10/2015   HCT 42.3 12/10/2015   PLT 232.0 12/10/2015   GLUCOSE 90 12/10/2015   ALT 16 12/10/2015   AST 24 12/10/2015   NA 135 12/10/2015   K 4.0 12/10/2015   CL 99 12/10/2015   CREATININE 0.91 12/10/2015   BUN 12 12/10/2015   CO2 25 12/10/2015   TSH 0.61 12/10/2015    No results found.  Assessment & Plan:   There are no diagnoses linked to this encounter.   No orders of the defined types were placed in this encounter.    Follow-up: No follow-ups on file.  Sonda Primes, MD

## 2020-04-03 NOTE — Assessment & Plan Note (Signed)
Cameron Kent went off Suboxone gradually - lost more wt due to withdrawal. He is asking me to prescribe his meds. He did not "click" w/[facility] See Rx

## 2020-04-03 NOTE — Assessment & Plan Note (Signed)
[facility] reports using less

## 2020-04-03 NOTE — Assessment & Plan Note (Signed)
Cameron Kent went off Suboxone gradually - lost more wt due to withdrawal. He is asking me to prescribe his meds. He did not "click" w/[facility]

## 2020-04-08 ENCOUNTER — Emergency Department (HOSPITAL_COMMUNITY)
Admission: EM | Admit: 2020-04-08 | Discharge: 2020-04-08 | Disposition: A | Discharge status: Home / Self Care | Payer: 59 | Attending: Emergency Medicine | Admitting: Emergency Medicine

## 2020-04-08 ENCOUNTER — Other Ambulatory Visit: Payer: Self-pay

## 2020-04-08 ENCOUNTER — Encounter (HOSPITAL_COMMUNITY): Payer: Self-pay

## 2020-04-08 DIAGNOSIS — F17290 Nicotine dependence, other tobacco product, uncomplicated: Secondary | ICD-10-CM | POA: Insufficient documentation

## 2020-04-08 DIAGNOSIS — Z79899 Other long term (current) drug therapy: Secondary | ICD-10-CM | POA: Insufficient documentation

## 2020-04-08 DIAGNOSIS — F1910 Other psychoactive substance abuse, uncomplicated: Secondary | ICD-10-CM | POA: Diagnosis present

## 2020-04-08 MED ORDER — CHLORDIAZEPOXIDE HCL 25 MG PO CAPS
ORAL_CAPSULE | ORAL | 0 refills | Status: AC
Start: 2020-04-08 — End: ?

## 2020-04-08 NOTE — ED Triage Notes (Signed)
Patient bib gems, c/o withdrawing from benzos. Last one taken 12 hours ago. Patient n/v en route. VS WNL per EMS.

## 2020-04-08 NOTE — ED Triage Notes (Signed)
EMS reports withdrawal from Xanax? (street use), Pt parents states drug abuse for 7 years. Pt unable to acquire drugs currently, not withdrawing voluntarily. Pt states last use approx. 12 hours ago.  BP 115/75 HR 82 RR 18 Sp02 99 RA CBG 123 Temp 97.7

## 2020-04-08 NOTE — ED Provider Notes (Signed)
[facility]-EMERGENCY DEPT Provider Note   CSN: [id number] Arrival date & time: 04/08/20  5176     History Chief Complaint  Patient presents with  . Withdrawal    benzo    Cameron Kent is a 24 y.o. male.  HPI Patient is a 24 y/o male with a medical history and noted below. He notes a h/o polysubstance abuse and periodically snorts heroin and takes PO xanax on a regular basis. He states that he typically takes 4-5 mg of xanax per week and acquires it without an rx. About 16 hours ago he took 1 mg of xanax and states that later in the evening he began experiencing lightheadedness as well as tingling in his hands. He also experienced intermittent episodes of diffuse abdominal pain with nausea and vomiting. These sx have since resolved. He states he currently feels tired. He has never experienced a withdrawal sz but was concerned that he might experience one so he called EMS. Pt notes baseline SI that has not acutely worsened, per patient. He denies fevers, chills, CP, SOB, syncope, HI, visual or auditory hallucinations.      Past Medical History:  Diagnosis Date  . ADHD (attention deficit hyperactivity disorder)    took Adderall as a kid  . Anxiety   . Benzodiazepine abuse (HCC)    2021  . Depression   . Opioid abuse (HCC)    pills 2020, heroine snorting 2021    Patient Active Problem List   Diagnosis Date Noted  . Tetrahydrocannabinol (THC) use disorder, moderate, dependence (HCC) 04/03/2020  . Anxiety disorder 12/08/2018  . Benzodiazepine abuse in remission (HCC) 12/08/2018  . Opioid abuse, in remission (HCC) 12/08/2018  . Bronchitis 01/07/2017  . Well adult exam 12/10/2015    Past Surgical History:  Procedure Laterality Date  . CYSTIC HYGROMA EXCISION    . KNEE ARTHROSCOPY WITH ANTERIOR CRUCIATE LIGAMENT (ACL) REPAIR Right   . LAPAROSCOPIC APPENDECTOMY  12/27/2011   Procedure: APPENDECTOMY LAPAROSCOPIC;  Surgeon: Judie Petit. Leonia Corona, MD;   Location: [facility] OR;  Service: Pediatrics;  Laterality: N/A;       Family History  Problem Relation Age of Onset  . COPD Maternal Grandmother   . Asthma Maternal Grandmother   . Hearing loss Maternal Grandmother   . Heart disease Maternal Grandmother   . Hypertension Maternal Grandmother   . Hearing loss Paternal Grandfather   . Bone cancer Paternal Grandfather   . Anxiety disorder Mother   . Hypertension Father   . Healthy Sister   . Dementia Maternal Grandfather   . Healthy Paternal Grandmother   . Bipolar disorder Cousin     Social History   Tobacco Use  . Smoking status: Current Some Day Smoker    Types: E-cigarettes  . Smokeless tobacco: Never Used  Substance Use Topics  . Alcohol use: Yes    Alcohol/week: 0.0 - 1.0 standard drinks  . Drug use: Yes    Frequency: 7.0 times per week    Types: Marijuana    Comment: 1 g a day qd    Home Medications Prior to Admission medications   Medication Sig Start Date End Date Taking? Authorizing Provider  cloNIDine (CATAPRES) 0.1 MG tablet Take 1 tablet (0.1 mg total) by mouth 2 (two) times daily as needed. 04/03/20   Plotnikov, Georgina Quint, MD  fluvoxaMINE (LUVOX) 100 MG tablet TAKE 1 TABLET(100 MG) BY MOUTH AT BEDTIME 03/28/20   Hurst, Teresa T, PA-C  propranolol (INDERAL) 10 MG tablet Take  1 tablet (10 mg total) by mouth 4 (four) times daily as needed. 04/03/20   Plotnikov, Georgina Quint, MD  QUEtiapine (SEROQUEL) 100 MG tablet Take 0.5 tablets (50 mg total) by mouth at bedtime as needed. 04/03/20   Plotnikov, Georgina Quint, MD    Allergies    Amoxicillin  Review of Systems   Review of Systems  All other systems reviewed and are negative. Ten systems reviewed and are negative for acute change, except as noted in the HPI.   Physical Exam Updated Vital Signs BP 121/72   Pulse 62   Temp 98.9 F (37.2 C)   Resp 14   SpO2 100%   Physical Exam Vitals and nursing note reviewed.  Constitutional:      General: He is not in acute  distress.    Appearance: Normal appearance. He is not ill-appearing, toxic-appearing or diaphoretic.     Comments: Well developed but cachectic adult male that is lying supine. He speaks clearly and coherently.   HENT:     Head: Normocephalic and atraumatic.     Right Ear: External ear normal.     Left Ear: External ear normal.     Nose: Nose normal.     Mouth/Throat:     Mouth: Mucous membranes are moist.     Pharynx: Oropharynx is clear. No oropharyngeal exudate or posterior oropharyngeal erythema.  Eyes:     General: No scleral icterus.       Right eye: No discharge.        Left eye: No discharge.     Extraocular Movements: Extraocular movements intact.     Conjunctiva/sclera: Conjunctivae normal.     Pupils: Pupils are equal, round, and reactive to light.  Cardiovascular:     Rate and Rhythm: Normal rate and regular rhythm.     Pulses: Normal pulses.     Heart sounds: Normal heart sounds. No murmur heard.  No friction rub. No gallop.      Comments: RRR. Not tachycardic.  Pulmonary:     Effort: Pulmonary effort is normal. No respiratory distress.     Breath sounds: Normal breath sounds. No stridor. No wheezing, rhonchi or rales.  Abdominal:     General: Abdomen is flat.     Palpations: Abdomen is soft.     Tenderness: There is no abdominal tenderness.     Comments: abd soft and nontender  Musculoskeletal:        General: Normal range of motion.     Cervical back: Normal range of motion and neck supple. No tenderness.     Right lower leg: No edema.     Left lower leg: No edema.  Skin:    General: Skin is warm and dry.  Neurological:     General: No focal deficit present.     Mental Status: He is alert and oriented to person, place, and time.     Comments: A&O x 3. Speaking clearly and coherently. No tremors appreciated with arms fully extended. No arm drift. No asterixis. Moving all four extremities with ease.   Psychiatric:        Mood and Affect: Mood normal.         Behavior: Behavior normal.    ED Results / Procedures / Treatments   Labs (all labs ordered are listed, but only abnormal results are displayed) Labs Reviewed - No data to display  EKG None  Radiology No results found.  Procedures Procedures   Medications Ordered in ED Medications -  No data to display  ED Course  I have reviewed the triage vital signs and the nursing notes.  Pertinent labs & imaging results that were available during my care of the patient were reviewed by me and considered in my medical decision making (see chart for details).    MDM Rules/Calculators/A&P                          Pt is a 24 y.o. male that presents with a history, physical exam, and ED Clinical Course as noted above.   Pt presents today due to what appears to be anxiety after taking xanax yesterday afternoon. Pt has a history of substance abuse and takes 4-5 mg of xanax per week. His last dose was about 12 hours PTA.   Pt's vital signs have been stable since arrival. Not tachycardic or hypoxic. Normotensive. His physical exam is reassuring and neuro exam is benign. He was monitored in the ED for the past few hours and has been resting comfortably in bed. He states he is feeling "ok" and is ready to be discharged.  I am going to rx a librium taper for the patient. He is planning on finding outpatient help this week as well for drug abuse. Will give pt additional resources. He was given very strict return precautions and we discussed sx of withdrawal. He verbalized understanding of this and his questions were answered. He was amicable at the time of d/c. VSS.    An After Visit Summary was printed and given to the patient.  Patient discharged to home/self care.  Condition at discharge: Stable  Note: Portions of this report may have been transcribed using voice recognition software. Every effort was made to ensure accuracy; however, inadvertent computerized transcription errors may be present.     Final Clinical Impression(s) / ED Diagnoses Final diagnoses:  Polysubstance abuse [facility])   Rx / DC Orders ED Discharge Orders    None       Placido Sou, PA-C 04/08/20 1236    Arby Barrette, MD 04/25/20 1515

## 2020-04-10 ENCOUNTER — Telehealth: Payer: Self-pay

## 2020-04-10 NOTE — Telephone Encounter (Signed)
New message    The patient father sent my chart messages last night under [employer].   The son will be admitted to a facility in [place] they will be leaving around 1:00 pm today   The medical director needs a letter stating the patient has no underlying issues they are concern about is BMI.   The father can come by the office to pick up the letter.

## 2020-04-10 NOTE — Telephone Encounter (Signed)
Pt's dad came into office for letter. Letter has already been given

## 2020-05-11 ENCOUNTER — Other Ambulatory Visit: Payer: Self-pay | Admitting: Internal Medicine

## 2020-05-11 ENCOUNTER — Telehealth: Payer: Self-pay | Admitting: Internal Medicine

## 2020-05-11 NOTE — Telephone Encounter (Signed)
   Patient recently discharged from a treatment facility. Patient was advised to take Revia, but no prescription was written. Patient requesting order from Dr Posey Rea.

## 2020-05-16 ENCOUNTER — Other Ambulatory Visit: Payer: Self-pay | Admitting: Internal Medicine

## 2020-05-16 MED ORDER — NALTREXONE HCL 50 MG PO TABS
25.0000 mg | ORAL_TABLET | Freq: Every day | ORAL | 1 refills | Status: AC
Start: 2020-05-16 — End: ?

## 2020-05-16 NOTE — Telephone Encounter (Signed)
It was addressed.  Thanks
